# Patient Record
Sex: Male | Born: 1995 | Race: Black or African American | Hispanic: No | Marital: Single | Smoking: Current some day smoker
Health system: Southern US, Community
[De-identification: ages and names within clinical notes are randomized; demographics above are authoritative.]

## PROBLEM LIST (undated history)

## (undated) DIAGNOSIS — J45909 Unspecified asthma, uncomplicated: Secondary | ICD-10-CM

---
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
## 2015-09-29 ENCOUNTER — Emergency Department (INDEPENDENT_AMBULATORY_CARE_PROVIDER_SITE_OTHER)
Admission: EM | Admit: 2015-09-29 | Discharge: 2015-09-29 | Disposition: A | Discharge status: Home / Self Care | Payer: 59 | Source: Home / Self Care | Attending: Family Medicine | Admitting: Family Medicine

## 2015-09-29 ENCOUNTER — Encounter (HOSPITAL_COMMUNITY): Payer: Self-pay | Admitting: *Deleted

## 2015-09-29 DIAGNOSIS — S0592XA Unspecified injury of left eye and orbit, initial encounter: Secondary | ICD-10-CM

## 2015-09-29 NOTE — ED Notes (Signed)
Pt   Advised  To  Go  Directly  To  The  Eye   Dr     Isidore Moosffice        And  Do not  Stop       Directions  Give   And  The  Importance  Of  Adequate followup  Stressed  To the  Pt
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
## 2015-09-29 NOTE — ED Provider Notes (Signed)
CSN: [id number]     Arrival date & time 09/29/15  1633 History   First MD Initiated Contact with Patient 09/29/15 1701     Chief Complaint  Patient presents with  . Eye Injury   (Consider location/radiation/quality/duration/timing/severity/associated sxs/prior Treatment) Patient is a 19 y.o. male presenting with eye injury. The history is provided by the patient.  Eye Injury This is a new problem. The current episode started 6 to 12 hours ago (hit in left eye this am with nuff ball and grad lost vision in eye.). The problem has been gradually worsening.    Past Medical History  Diagnosis Date  . Asthma    History reviewed. No pertinent past surgical history. History reviewed. No pertinent family history. Social History  Substance Use Topics  . Smoking status: Never Smoker   . Smokeless tobacco: None  . Alcohol Use: No    Review of Systems  Constitutional: Negative.   Eyes: Positive for redness and visual disturbance. Negative for pain, discharge and itching.  All other systems reviewed and are negative.   Allergies  Review of patient's allergies indicates no known allergies.  Home Medications   Prior to Admission medications   Medication Sig Start Date End Date Taking? Authorizing Provider  terbinafine (LAMISIL) 250 MG tablet Take 250 mg by mouth daily.    Historical Provider, MD   Meds Ordered and Administered this Visit  Medications - No data to display  BP 144/67 mmHg  Pulse 62  Temp(Src) 98.4 F (36.9 C) (Oral)  Resp 20  SpO2 99% No data found.   Physical Exam  Constitutional: He appears well-developed and well-nourished. No distress.  Eyes: EOM and lids are normal. Lids are everted and swept, no foreign bodies found. Left conjunctiva is injected.  Fundoscopic exam:      The left eye shows hemorrhage.  Slit lamp exam:      The left eye shows no hyphema.  Nursing note and vitals reviewed.   ED Course  Procedures (including critical care  time)  Labs Review Labs Reviewed - No data to display  Imaging Review No results found.   Visual Acuity Review  Right Eye Distance:   Left Eye Distance:   Bilateral Distance:    Right Eye Near:   Left Eye Near:    Bilateral Near:         MDM   1. Eye injury, left, initial encounter    Discussed with dr bowen-ophth- who advised that pt come directly and immed to his office for eval.pt understands and agrees.   Linna HoffJames D Roarke Marciano, MD 09/29/15 [phone]

## 2015-09-29 NOTE — Discharge Instructions (Signed)
Go directly to eye doctor °

## 2016-06-06 ENCOUNTER — Emergency Department (HOSPITAL_COMMUNITY): Payer: 59

## 2016-06-06 ENCOUNTER — Emergency Department (HOSPITAL_COMMUNITY)
Admission: EM | Admit: 2016-06-06 | Discharge: 2016-06-06 | Disposition: A | Discharge status: Home / Self Care | Payer: 59 | Attending: Emergency Medicine | Admitting: Emergency Medicine

## 2016-06-06 ENCOUNTER — Encounter (HOSPITAL_COMMUNITY): Payer: Self-pay | Admitting: *Deleted

## 2016-06-06 DIAGNOSIS — S93402A Sprain of unspecified ligament of left ankle, initial encounter: Secondary | ICD-10-CM | POA: Diagnosis not present

## 2016-06-06 DIAGNOSIS — Y929 Unspecified place or not applicable: Secondary | ICD-10-CM | POA: Diagnosis not present

## 2016-06-06 DIAGNOSIS — W230XXA Caught, crushed, jammed, or pinched between moving objects, initial encounter: Secondary | ICD-10-CM | POA: Insufficient documentation

## 2016-06-06 DIAGNOSIS — J45909 Unspecified asthma, uncomplicated: Secondary | ICD-10-CM | POA: Insufficient documentation

## 2016-06-06 DIAGNOSIS — Y9389 Activity, other specified: Secondary | ICD-10-CM | POA: Insufficient documentation

## 2016-06-06 DIAGNOSIS — F1290 Cannabis use, unspecified, uncomplicated: Secondary | ICD-10-CM | POA: Diagnosis not present

## 2016-06-06 DIAGNOSIS — F17210 Nicotine dependence, cigarettes, uncomplicated: Secondary | ICD-10-CM | POA: Diagnosis not present

## 2016-06-06 DIAGNOSIS — S99912A Unspecified injury of left ankle, initial encounter: Secondary | ICD-10-CM | POA: Diagnosis present

## 2016-06-06 DIAGNOSIS — Y990 Civilian activity done for income or pay: Secondary | ICD-10-CM | POA: Diagnosis not present

## 2016-06-06 MED ORDER — NAPROXEN 500 MG PO TABS: 500.0000 mg | ORAL_TABLET | Freq: Two times a day (BID) | ORAL | Status: DC

## 2016-06-06 NOTE — ED Provider Notes (Signed)
CSN: [id number]     Arrival date & time 06/06/16  2114 History  By signing my name below, I, Evon Slackerrance Branch, attest that this documentation has been prepared under the direction and in the presence of TRW AutomotiveKelly Rilyn Upshaw, PA-C. Electronically Signed: Evon Slackerrance Branch, ED Scribe. 06/06/2016. 9:45 PM.      Chief Complaint  Patient presents with  . Ankle Pain    Patient is a 20 y.o. male presenting with ankle pain. The history is provided by the patient. No language interpreter was used.  Ankle Pain  HPI Comments: Bryan Lin is a 20 y.o. male who presents to the Emergency Department complaining of left ankle pain onset today PTA. Pt states that his ankle was "smashed" in between 2 forklifts. Pt presents with associated swelling. Pt states that he is able to ambulate. He states that movement makes the pain worse. Pt states that he has tried ibuprofen with no relief. Pt denies numbness or tingling. No hx of prior injuries to L ankle.  Past Medical History  Diagnosis Date  . Asthma    History reviewed. No pertinent past surgical history. No family history on file. Social History  Substance Use Topics  . Smoking status: Current Every Day Smoker -- 0.50 packs/day    Types: Cigarettes  . Smokeless tobacco: None  . Alcohol Use: Yes     Comment: occ    Review of Systems  Musculoskeletal: Positive for joint swelling and arthralgias. Negative for gait problem.  Neurological: Negative for numbness.  All other systems reviewed and are negative.   Allergies  Review of patient's allergies indicates no known allergies.  Home Medications   Prior to Admission medications   Medication Sig Start Date End Date Taking? Authorizing Provider  naproxen (NAPROSYN) 500 MG tablet Take 1 tablet (500 mg total) by mouth 2 (two) times daily. 06/06/16   Antony MaduraKelly Shizue Kaseman, PA-C  terbinafine (LAMISIL) 250 MG tablet Take 250 mg by mouth daily.    Historical Provider, MD   BP 131/66 mmHg  Pulse 53  Temp(Src) 98.8 F  (37.1 C) (Oral)  Resp 18  Ht 6\' 3"  (1.905 m)  Wt 70.308 kg  BMI 19.37 kg/m2  SpO2 99%   Physical Exam  Constitutional: He is oriented to person, place, and time. He appears well-developed and well-nourished. No distress.  HENT:  Head: Normocephalic and atraumatic.  Eyes: Conjunctivae and EOM are normal. No scleral icterus.  Neck: Normal range of motion.  Cardiovascular: Normal rate, regular rhythm and intact distal pulses.   DP and PT pulses 2+ in the LLE. Capillary refill brisk in all digits of L foot.  Pulmonary/Chest: Effort normal. No respiratory distress.  Respirations even and unlabored.  Musculoskeletal: Normal range of motion.       Left ankle: He exhibits swelling. He exhibits normal range of motion and no deformity. Tenderness. Lateral malleolus and medial malleolus tenderness found. Achilles tendon normal.  Neurological: He is alert and oriented to person, place, and time. He exhibits normal muscle tone. Coordination normal.  Sensation to light touch intact in the LLE. Patient able to wiggle all toes.  Skin: Skin is warm and dry. No rash noted. He is not diaphoretic. No erythema. No pallor.  Psychiatric: He has a normal mood and affect. His behavior is normal.  Nursing note and vitals reviewed.   ED Course  Procedures (including critical care time) DIAGNOSTIC STUDIES: Oxygen Saturation is 98% on RA, normal by my interpretation.    COORDINATION OF CARE: 9:45 PM-Discussed treatment  plan with pt at bedside and pt agreed to plan.    Labs Review Labs Reviewed - No data to display  Imaging Review Dg Ankle Complete Left  06/06/2016  CLINICAL DATA:  20 year old male with left ankle injury. EXAM: LEFT ANKLE COMPLETE - 3+ VIEW COMPARISON:  None. FINDINGS: There is no acute fracture or dislocation. The bones are well mineralized. The ankle mortise is intact. There is mild soft tissue swelling of the ankle. No radiopaque foreign object. IMPRESSION: Negative. Electronically  Signed   By: Elgie CollardArash  Radparvar M.D.   On: 06/06/2016 22:20      EKG Interpretation None      MDM   Final diagnoses:  Ankle sprain, left, initial encounter    20 year old male since to the emergency department for evaluation of left ankle pain. He has normal range of motion and he is neurovascularly intact. Patient has been weightbearing since his injury. X-ray negative for fracture. Suspect ankle sprain. ASO ankle applied and have recommended supportive care including icing, elevation, and rest. Ibuprofen recommended for pain and return precautions given. Patient discharged in satisfactory condition with no unaddressed concerns.  I personally performed the services described in this documentation, which was scribed in my presence. The recorded information has been reviewed and is accurate.    Filed Vitals:   06/06/16 2129 06/06/16 2250  BP: 135/70 131/66  Pulse: 60 53  Temp: 98.8 F (37.1 C)   TempSrc: Oral   Resp: 18 18  Height: 6\' 3"  (1.905 m)   Weight: 70.308 kg   SpO2: 98% 99%        Antony MaduraKelly Billyjoe Go, PA-C 06/07/16 0012  Lavera Guiseana Duo Liu, MD 06/07/16 [phone]

## 2016-06-06 NOTE — Discharge Instructions (Signed)
Ankle Sprain °An ankle sprain is an injury to the strong, fibrous tissues (ligaments) that hold the bones of your ankle joint together.  °CAUSES °An ankle sprain is usually caused by a fall or by twisting your ankle. Ankle sprains most commonly occur when you step on the outer edge of your foot, and your ankle turns inward. People who participate in sports are more prone to these types of injuries.  °SYMPTOMS  °· Pain in your ankle. The pain may be present at rest or only when you are trying to stand or walk. °· Swelling. °· Bruising. Bruising may develop immediately or within 1 to 2 days after your injury. °· Difficulty standing or walking, particularly when turning corners or changing directions. °DIAGNOSIS  °Your caregiver will ask you details about your injury and perform a physical exam of your ankle to determine if you have an ankle sprain. During the physical exam, your caregiver will press on and apply pressure to specific areas of your foot and ankle. Your caregiver will try to move your ankle in certain ways. An X-ray exam may be done to be sure a bone was not broken or a ligament did not separate from one of the bones in your ankle (avulsion fracture).  °TREATMENT  °Certain types of braces can help stabilize your ankle. Your caregiver can make a recommendation for this. Your caregiver may recommend the use of medicine for pain. If your sprain is severe, your caregiver may refer you to a surgeon who helps to restore function to parts of your skeletal system (orthopedist) or a physical therapist. °HOME CARE INSTRUCTIONS  °· Apply ice to your injury for 1-2 days or as directed by your caregiver. Applying ice helps to reduce inflammation and pain. °· Put ice in a plastic bag. °· Place a towel between your skin and the bag. °· Leave the ice on for 15-20 minutes at a time, every 2 hours while you are awake. °· Only take over-the-counter or prescription medicines for pain, discomfort, or fever as directed by  your caregiver. °· Elevate your injured ankle above the level of your heart as much as possible for 2-3 days. °· If your caregiver recommends crutches, use them as instructed. Gradually put weight on the affected ankle. Continue to use crutches or a cane until you can walk without feeling pain in your ankle. °· If you have a plaster splint, wear the splint as directed by your caregiver. Do not rest it on anything harder than a pillow for the first 24 hours. Do not put weight on it. Do not get it wet. You may take it off to take a shower or bath. °· You may have been given an elastic bandage to wear around your ankle to provide support. If the elastic bandage is too tight (you have numbness or tingling in your foot or your foot becomes cold and blue), adjust the bandage to make it comfortable. °· If you have an air splint, you may blow more air into it or let air out to make it more comfortable. You may take your splint off at night and before taking a shower or bath. Wiggle your toes in the splint several times per day to decrease swelling. °SEEK MEDICAL CARE IF:  °· You have rapidly increasing bruising or swelling. °· Your toes feel extremely cold or you lose feeling in your foot. °· Your pain is not relieved with medicine. °SEEK IMMEDIATE MEDICAL CARE IF: °· Your toes are numb or blue. °·   You have severe pain that is increasing. °MAKE SURE YOU:  °· Understand these instructions. °· Will watch your condition. °· Will get help right away if you are not doing well or get worse. °  °This information is not intended to replace advice given to you by your health care provider. Make sure you discuss any questions you have with your health care provider. °  °Document Released: 11/26/2005 Document Revised: 12/17/2014 Document Reviewed: 12/08/2011 °Elsevier Interactive Patient Education ©2016 [employer] °RICE for Routine Care of Injuries °The routine care of many injuries includes rest, ice, compression, and elevation  (RICE therapy). RICE therapy is often recommended for injuries to soft tissues, such as a muscle strain, ligament injuries, bruises, and overuse injuries. It can also be used for some bony injuries. Using RICE therapy can help to relieve pain, lessen swelling, and enable your body to heal. °Rest °Rest is required to allow your body to heal. This usually involves reducing your normal activities and avoiding use of the injured part of your body. Generally, you can return to your normal activities when you are comfortable and have been given permission by your health care provider. °Ice °Icing your injury helps to keep the swelling down, and it lessens pain. Do not apply ice directly to your skin. °· Put ice in a plastic bag. °· Place a towel between your skin and the bag. °· Leave the ice on for 20 minutes, 2-3 times a day. °Do this for as long as you are directed by your health care provider. °Compression °Compression means putting pressure on the injured area. Compression helps to keep swelling down, gives support, and helps with discomfort. Compression may be done with an elastic bandage. If an elastic bandage has been applied, follow these general tips: °· Remove and reapply the bandage every 3-4 hours or as directed by your health care provider. °· Make sure the bandage is not wrapped too tightly, because this can cut off circulation. If part of your body beyond the bandage becomes blue, numb, cold, swollen, or more painful, your bandage is most likely too tight. If this occurs, remove your bandage and reapply it more loosely. °· See your health care provider if the bandage seems to be making your problems worse rather than better. °Elevation °Elevation means keeping the injured area raised. This helps to lessen swelling and decrease pain. If possible, your injured area should be elevated at or above the level of your heart or the center of your chest. °WHEN SHOULD I SEEK MEDICAL CARE? °You should seek medical  care if: °· Your pain and swelling continue. °· Your symptoms are getting worse rather than improving. °These symptoms may indicate that further evaluation or further X-rays are needed. Sometimes, X-rays may not show a small broken bone (fracture) until a number of days later. Make a follow-up appointment with your health care provider. °WHEN SHOULD I SEEK IMMEDIATE MEDICAL CARE? °You should seek immediate medical care if: °· You have sudden severe pain at or below the area of your injury. °· You have redness or increased swelling around your injury. °· You have tingling or numbness at or below the area of your injury that does not improve after you remove the elastic bandage. °  °This information is not intended to replace advice given to you by your health care provider. Make sure you discuss any questions you have with your health care provider. °  °Document Released: 03/10/2001 Document Revised: 08/17/2015 Document Reviewed: 11/03/2014 °Elsevier Interactive Patient Education ©2016 [employer] ° °

## 2016-06-06 NOTE — ED Notes (Signed)
Pt states that he was at work and was on the [employer]; pt states that he got his left ankle in between 2 [employer]; pt states that it "mashed" his ankle in the back; pt able to wiggle toes and flex ankle without difficulty; obvious swelling noted to ankle; + pulse, + sensation

## 2017-09-16 ENCOUNTER — Telehealth: Payer: Self-pay

## 2017-09-16 ENCOUNTER — Ambulatory Visit: Payer: 59 | Admitting: Family Medicine

## 2017-09-16 NOTE — Telephone Encounter (Signed)
Pt called and states that he walked in to the [facility] office to request a new pt appt. He was scheduled to see Dr. Salomon Fick in our office tomorrow afternoon. He is concerned because he has been having some chest pain x>39mo. He states that it does often radiate down his arm. He reports the pain is located in his upper left chest/shoulder area. He denies any pain with inspiration or with movement of his shoulder/neck. He states that he did play sports when he was younger and thinks he "may have a pinched nerve".   Advised pt that based on his reported symptoms it may be best for him to seek evaluation at an UC. He declines and states that he "does not want to spend extra money". I advised the pt that if his symptoms increase, pain radiates to jaw or he has trouble taking deep breath he needs to seek UC/ED immediately and not wait until tomorrow. Pt voiced understanding. Nothing further needed at this time.

## 2017-09-17 ENCOUNTER — Ambulatory Visit (INDEPENDENT_AMBULATORY_CARE_PROVIDER_SITE_OTHER): Payer: 59 | Admitting: Family Medicine

## 2017-09-17 ENCOUNTER — Telehealth: Payer: Self-pay | Admitting: Cardiovascular Disease

## 2017-09-17 ENCOUNTER — Encounter: Payer: Self-pay | Admitting: Family Medicine

## 2017-09-17 VITALS — BP 140/100 | HR 50 | Temp 97.8°F | Ht 75.0 in | Wt 154.6 lb

## 2017-09-17 DIAGNOSIS — R030 Elevated blood-pressure reading, without diagnosis of hypertension: Secondary | ICD-10-CM | POA: Diagnosis not present

## 2017-09-17 DIAGNOSIS — R079 Chest pain, unspecified: Secondary | ICD-10-CM | POA: Diagnosis not present

## 2017-09-17 DIAGNOSIS — Z7689 Persons encountering health services in other specified circumstances: Secondary | ICD-10-CM

## 2017-09-17 LAB — COMPREHENSIVE METABOLIC PANEL
ALT: 12 U/L (ref 0–53)
AST: 16 U/L (ref 0–37)
Albumin: 4.8 g/dL (ref 3.5–5.2)
Alkaline Phosphatase: 42 U/L (ref 39–117)
BUN: 10 mg/dL (ref 6–23)
CO2: 30 mEq/L (ref 19–32)
Calcium: 9.8 mg/dL (ref 8.4–10.5)
Chloride: 102 mEq/L (ref 96–112)
Creatinine, Ser: 1.04 mg/dL (ref 0.40–1.50)
GFR: 115.75 mL/min (ref >60.00)
GLUCOSE: 86 mg/dL (ref 70–99)
POTASSIUM: 4.6 meq/L (ref 3.5–5.1)
SODIUM: 138 meq/L (ref 135–145)
TOTAL PROTEIN: 7.3 g/dL (ref 6.0–8.3)
Total Bilirubin: 0.7 mg/dL (ref 0.2–1.2)

## 2017-09-17 LAB — CBC WITH DIFFERENTIAL/PLATELET
Basophils Absolute: 0 10*3/uL (ref 0.0–0.1)
Basophils Relative: 0.4 % (ref 0.0–3.0)
EOS PCT: 1.9 % (ref 0.0–5.0)
Eosinophils Absolute: 0.1 10*3/uL (ref 0.0–0.7)
HCT: 44.2 % (ref 39.0–52.0)
Hemoglobin: 14.6 g/dL (ref 13.0–17.0)
Lymphocytes Relative: 25.4 % (ref 12.0–46.0)
Lymphs Abs: 1.5 10*3/uL (ref 0.7–4.0)
MCHC: 32.9 g/dL (ref 30.0–36.0)
MCV: 102 fl — ABNORMAL HIGH (ref 78.0–100.0)
MONO ABS: 0.6 10*3/uL (ref 0.1–1.0)
Monocytes Relative: 9.1 % (ref 3.0–12.0)
Neutro Abs: 3.8 10*3/uL (ref 1.4–7.7)
Neutrophils Relative %: 63.2 % (ref 43.0–77.0)
Platelets: 187 10*3/uL (ref 150.0–400.0)
RBC: 4.34 Mil/uL (ref 4.22–5.81)
RDW: 13.7 % (ref 11.5–15.5)
WBC: 6.1 10*3/uL (ref 4.0–10.5)

## 2017-09-17 NOTE — Patient Instructions (Addendum)
DASH Eating Plan DASH stands for "Dietary Approaches to Stop Hypertension." The DASH eating plan is a healthy eating plan that has been shown to reduce high blood pressure (hypertension). It may also reduce your risk for type 2 diabetes, heart disease, and stroke. The DASH eating plan may also help with weight loss. What are tips for following this plan? General guidelines  Avoid eating more than 2,300 mg (milligrams) of salt (sodium) a day. If you have hypertension, you may need to reduce your sodium intake to 1,500 mg a day.  Limit alcohol intake to no more than 1 drink a day for nonpregnant women and 2 drinks a day for men. One drink equals 12 oz of beer, 5 oz of wine, or 1 oz of hard liquor.  Work with your health care provider to maintain a healthy body weight or to lose weight. Ask what an ideal weight is for you.  Get at least 30 minutes of exercise that causes your heart to beat faster (aerobic exercise) most days of the week. Activities may include walking, swimming, or biking.  Work with your health care provider or diet and nutrition specialist (dietitian) to adjust your eating plan to your individual calorie needs. Reading food labels  Check food labels for the amount of sodium per serving. Choose foods with less than 5 percent of the Daily Value of sodium. Generally, foods with less than 300 mg of sodium per serving fit into this eating plan.  To find whole grains, look for the word "whole" as the first word in the ingredient list. Shopping  Buy products labeled as "low-sodium" or "no salt added."  Buy fresh foods. Avoid canned foods and premade or frozen meals. Cooking  Avoid adding salt when cooking. Use salt-free seasonings or herbs instead of table salt or sea salt. Check with your health care provider or pharmacist before using salt substitutes.  Do not fry foods. Cook foods using healthy methods such as baking, boiling, grilling, and broiling instead.  Cook with  heart-healthy oils, such as olive, canola, soybean, or sunflower oil. Meal planning   Eat a balanced diet that includes: ? 5 or more servings of fruits and vegetables each day. At each meal, try to fill half of your plate with fruits and vegetables. ? Up to 6-8 servings of whole grains each day. ? Less than 6 oz of lean meat, poultry, or fish each day. A 3-oz serving of meat is about the same size as a deck of cards. One egg equals 1 oz. ? 2 servings of low-fat dairy each day. ? A serving of nuts, seeds, or beans 5 times each week. ? Heart-healthy fats. Healthy fats called Omega-3 fatty acids are found in foods such as flaxseeds and coldwater fish, like sardines, salmon, and mackerel.  Limit how much you eat of the following: ? Canned or prepackaged foods. ? Food that is high in trans fat, such as fried foods. ? Food that is high in saturated fat, such as fatty meat. ? Sweets, desserts, sugary drinks, and other foods with added sugar. ? Full-fat dairy products.  Do not salt foods before eating.  Try to eat at least 2 vegetarian meals each week.  Eat more home-cooked food and less restaurant, buffet, and fast food.  When eating at a restaurant, ask that your food be prepared with less salt or no salt, if possible. What foods are recommended? The items listed may not be a complete list. Talk with your dietitian about what   dietary choices are best for you. Grains Whole-grain or whole-wheat bread. Whole-grain or whole-wheat pasta. Brown rice. Oatmeal. Quinoa. Bulgur. Whole-grain and low-sodium cereals. Pita bread. Low-fat, low-sodium crackers. Whole-wheat flour tortillas. Vegetables Fresh or frozen vegetables (raw, steamed, roasted, or grilled). Low-sodium or reduced-sodium tomato and vegetable juice. Low-sodium or reduced-sodium tomato sauce and tomato paste. Low-sodium or reduced-sodium canned vegetables. Fruits All fresh, dried, or frozen fruit. Canned fruit in natural juice (without  added sugar). Meat and other protein foods Skinless chicken or turkey. Ground chicken or turkey. Pork with fat trimmed off. Fish and seafood. Egg whites. Dried beans, peas, or lentils. Unsalted nuts, nut butters, and seeds. Unsalted canned beans. Lean cuts of beef with fat trimmed off. Low-sodium, lean deli meat. Dairy Low-fat (1%) or fat-free (skim) milk. Fat-free, low-fat, or reduced-fat cheeses. Nonfat, low-sodium ricotta or cottage cheese. Low-fat or nonfat yogurt. Low-fat, low-sodium cheese. Fats and oils Soft margarine without trans fats. Vegetable oil. Low-fat, reduced-fat, or light mayonnaise and salad dressings (reduced-sodium). Canola, safflower, olive, soybean, and sunflower oils. Avocado. Seasoning and other foods Herbs. Spices. Seasoning mixes without salt. Unsalted popcorn and pretzels. Fat-free sweets. What foods are not recommended? The items listed may not be a complete list. Talk with your dietitian about what dietary choices are best for you. Grains Baked goods made with fat, such as croissants, muffins, or some breads. Dry pasta or rice meal packs. Vegetables Creamed or fried vegetables. Vegetables in a cheese sauce. Regular canned vegetables (not low-sodium or reduced-sodium). Regular canned tomato sauce and paste (not low-sodium or reduced-sodium). Regular tomato and vegetable juice (not low-sodium or reduced-sodium). Pickles. Olives. Fruits Canned fruit in a light or heavy syrup. Fried fruit. Fruit in cream or butter sauce. Meat and other protein foods Fatty cuts of meat. Ribs. Fried meat. Bacon. Sausage. Bologna and other processed lunch meats. Salami. Fatback. Hotdogs. Bratwurst. Salted nuts and seeds. Canned beans with added salt. Canned or smoked fish. Whole eggs or egg yolks. Chicken or turkey with skin. Dairy Whole or 2% milk, cream, and half-and-half. Whole or full-fat cream cheese. Whole-fat or sweetened yogurt. Full-fat cheese. Nondairy creamers. Whipped toppings.  Processed cheese and cheese spreads. Fats and oils Butter. Stick margarine. Lard. Shortening. Ghee. Bacon fat. Tropical oils, such as coconut, palm kernel, or palm oil. Seasoning and other foods Salted popcorn and pretzels. Onion salt, garlic salt, seasoned salt, table salt, and sea salt. Worcestershire sauce. Tartar sauce. Barbecue sauce. Teriyaki sauce. Soy sauce, including reduced-sodium. Steak sauce. Canned and packaged gravies. Fish sauce. Oyster sauce. Cocktail sauce. Horseradish that you find on the shelf. Ketchup. Mustard. Meat flavorings and tenderizers. Bouillon cubes. Hot sauce and Tabasco sauce. Premade or packaged marinades. Premade or packaged taco seasonings. Relishes. Regular salad dressings. Where to find more information:  National Heart, Lung, and Blood Institute: www.nhlbi.nih.gov  American Heart Association: www.heart.org Summary  The DASH eating plan is a healthy eating plan that has been shown to reduce high blood pressure (hypertension). It may also reduce your risk for type 2 diabetes, heart disease, and stroke.  With the DASH eating plan, you should limit salt (sodium) intake to 2,300 mg a day. If you have hypertension, you may need to reduce your sodium intake to 1,500 mg a day.  When on the DASH eating plan, aim to eat more fresh fruits and vegetables, whole grains, lean proteins, low-fat dairy, and heart-healthy fats.  Work with your health care provider or diet and nutrition specialist (dietitian) to adjust your eating plan to your individual   calorie needs. This information is not intended to replace advice given to you by your health care provider. Make sure you discuss any questions you have with your health care provider. Document Released: 11/15/2011 Document Revised: 11/19/2016 Document Reviewed: 11/19/2016 Elsevier Interactive Patient Education  2017 Else[employer] Inc.  Managing Your Hypertension Hypertension is commonly called high blood pressure. This is when  the force of your blood pressing against the walls of your arteries is too strong. Arteries are blood vessels that carry blood from your heart throughout your body. Hypertension forces the heart to work harder to pump blood, and may cause the arteries to become narrow or stiff. Having untreated or uncontrolled hypertension can cause heart attack, stroke, kidney disease, and other problems. What are blood pressure readings? A blood pressure reading consists of a higher number over a lower number. Ideally, your blood pressure should be below 120/80. The first ("top") number is called the systolic pressure. It is a measure of the pressure in your arteries as your heart beats. The second ("bottom") number is called the diastolic pressure. It is a measure of the pressure in your arteries as the heart relaxes. What does my blood pressure reading mean? Blood pressure is classified into four stages. Based on your blood pressure reading, your health care provider may use the following stages to determine what type of treatment you need, if any. Systolic pressure and diastolic pressure are measured in a unit called mm Hg. Normal  Systolic pressure: below 120.  Diastolic pressure: below 80. Elevated  Systolic pressure: 120-129.  Diastolic pressure: below 80. Hypertension stage 1  Systolic pressure: 130-139.  Diastolic pressure: 80-89. Hypertension stage 2  Systolic pressure: 140 or above.  Diastolic pressure: 90 or above. What health risks are associated with hypertension? Managing your hypertension is an important responsibility. Uncontrolled hypertension can lead to:  A heart attack.  A stroke.  A weakened blood vessel (aneurysm).  Heart failure.  Kidney damage.  Eye damage.  Metabolic syndrome.  Memory and concentration problems.  What changes can I make to manage my hypertension? Hypertension can be managed by making lifestyle changes and possibly by taking medicines. Your  health care provider will help you make a plan to bring your blood pressure within a normal range. Eating and drinking  Eat a diet that is high in fiber and potassium, and low in salt (sodium), added sugar, and fat. An example eating plan is called the DASH (Dietary Approaches to Stop Hypertension) diet. To eat this way: ? Eat plenty of fresh fruits and vegetables. Try to fill half of your plate at each meal with fruits and vegetables. ? Eat whole grains, such as whole wheat pasta, brown rice, or whole grain bread. Fill about one quarter of your plate with whole grains. ? Eat low-fat diary products. ? Avoid fatty cuts of meat, processed or cured meats, and poultry with skin. Fill about one quarter of your plate with lean proteins such as fish, chicken without skin, beans, eggs, and tofu. ? Avoid premade and processed foods. These tend to be higher in sodium, added sugar, and fat.  Reduce your daily sodium intake. Most people with hypertension should eat less than 1,500 mg of sodium a day.  Limit alcohol intake to no more than 1 drink a day for nonpregnant women and 2 drinks a day for men. One drink equals 12 oz of beer, 5 oz of wine, or 1 oz of hard liquor. Lifestyle  Work with your health care provider   to maintain a healthy body weight, or to lose weight. Ask what an ideal weight is for you.  Get at least 30 minutes of exercise that causes your heart to beat faster (aerobic exercise) most days of the week. Activities may include walking, swimming, or biking.  Include exercise to strengthen your muscles (resistance exercise), such as weight lifting, as part of your weekly exercise routine. Try to do these types of exercises for 30 minutes at least 3 days a week.  Do not use any products that contain nicotine or tobacco, such as cigarettes and e-cigarettes. If you need help quitting, ask your health care provider.  Control any long-term (chronic) conditions you have, such as high cholesterol  or diabetes. Monitoring  Monitor your blood pressure at home as told by your health care provider. Your personal target blood pressure may vary depending on your medical conditions, your age, and other factors.  Have your blood pressure checked regularly, as often as told by your health care provider. Working with your health care provider  Review all the medicines you take with your health care provider because there may be side effects or interactions.  Talk with your health care provider about your diet, exercise habits, and other lifestyle factors that may be contributing to hypertension.  Visit your health care provider regularly. Your health care provider can help you create and adjust your plan for managing hypertension. Will I need medicine to control my blood pressure? Your health care provider may prescribe medicine if lifestyle changes are not enough to get your blood pressure under control, and if:  Your systolic blood pressure is 130 or higher.  Your diastolic blood pressure is 80 or higher.  Take medicines only as told by your health care provider. Follow the directions carefully. Blood pressure medicines must be taken as prescribed. The medicine does not work as well when you skip doses. Skipping doses also puts you at risk for problems. Contact a health care provider if:  You think you are having a reaction to medicines you have taken.  You have repeated (recurrent) headaches.  You feel dizzy.  You have swelling in your ankles.  You have trouble with your vision. Get help right away if:  You develop a severe headache or confusion.  You have unusual weakness or numbness, or you feel faint.  You have severe pain in your chest or abdomen.  You vomit repeatedly.  You have trouble breathing. Summary  Hypertension is when the force of blood pumping through your arteries is too strong. If this condition is not controlled, it may put you at risk for serious  complications.  Your personal target blood pressure may vary depending on your medical conditions, your age, and other factors. For most people, a normal blood pressure is less than 120/80.  Hypertension is managed by lifestyle changes, medicines, or both. Lifestyle changes include weight loss, eating a healthy, low-sodium diet, exercising more, and limiting alcohol. This information is not intended to replace advice given to you by your health care provider. Make sure you discuss any questions you have with your health care provider. Document Released: 08/20/2012 Document Revised: 10/24/2016 Document Reviewed: 10/24/2016 Elsevier Interactive Patient Education  2018 Elsevier Inc.  

## 2017-09-17 NOTE — Progress Notes (Addendum)
Patient presents to clinic today to establish care.  SUBJECTIVE: PMH: Pt is a 21 yo with pmh sig for childhood asthma.  Pt was formerly seen by Dr. Carlynn Purl, Pediatrics.  Chest Pain, acute: -x 2 months -starts in upper left chest, underneath collar bone. Will sometimes radiate to L shoulder and sometimes into L arm. -Pt endorses some SOB with the pain -pain noted as aggravating, will keep awake at night.   -Ibuprofen 800 mg helps -denies HA, blurred vision, N/V, recent injury -Pt played football in HS.  Endorses having "zingers" aka pinched nerves, but he never had that checked out.  Allergies: NKDA  PSurgHx:  None  Social Hx: Pt is single.  He graduated HS and is currently employed by [employer] as a Pensions consultant.  Pt recently moved back to the area from [place].  Pt endorses smoking 2-3 cigs per wk.  Pt endorses heavy drinking while in [place], stating would drink about a fifth a day.  Now a fifth of [place] may  last a few days.  Pt also endorses daily marijuana use.  Family Hx: Mom-HLD, HTN Dad-HLD, HTN, CVA Sister, Faith- DM Sister, 2023/03/15- AAW Brother-Shawn- deceased.    Health Maintenance: Dental -- Dr. Allison Quarry   Past Medical History:  Diagnosis Date  . Asthma     History reviewed. No pertinent surgical history.  No current outpatient prescriptions on file prior to visit.   No current facility-administered medications on file prior to visit.     No Known Allergies  Family History  Problem Relation Age of Onset  . Hypertension Mother   . Hypertension Father   . Stroke Father   . Diabetes Sister     Social History   Social History  . Marital status: Single    Spouse name: N/A  . Number of children: N/A  . Years of education: N/A   Occupational History  . Not on file.   Social History Main Topics  . Smoking status: Current Some Day Smoker    Packs/day: 0.50    Types: Cigarettes  . Smokeless tobacco: Never Used  . Alcohol use Yes     Comment: occ  .  Drug use: Yes    Types: Marijuana  . Sexual activity: Not on file   Other Topics Concern  . Not on file   Social History Narrative  . No narrative on file    ROS General: Denies fever, chills, night sweats, changes in weight, changes in appetite HEENT: Denies headaches, ear pain, changes in vision, rhinorrhea, sore throat CV: Denies palpitations, orthopnea  +Chest pain, some SOB Pulm: Denies cough, wheezing  +SOB GI: Denies abdominal pain, nausea, vomiting, diarrhea, constipation GU: Denies dysuria, hematuria, frequency, vaginal discharge Msk: Denies muscle cramps, joint pains Neuro: Denies weakness, numbness, tingling Skin: Denies rashes, bruising Psych: Denies depression, anxiety, hallucinations  BP (!) 140/100 (BP Location: Right Arm, Patient Position: Sitting, Cuff Size: Normal)   Pulse (!) 50   Temp 97.8 F (36.6 C) (Oral)   Ht  (1.905 m)   Wt 154 lb 9.6 oz (70.1 kg)   BMI 19.32 kg/m   Physical Exam Gen. Pleasant, well developed, well-nourished, in NAD HEENT - [place]/AT, PERRL, no scleral icterus, no nasal drainage, pharynx without erythema or exudate. Neck: No JVD, no thyromegaly Lungs: no accessory muscle use, CTAB, no wheezes, rales or rhonchi Cardiovascular: RRR, No r/g/m, no peripheral edema Abdomen: BS present, soft, nontender,nondistended, no hepatosplenomegaly Musculoskeletal: No deformities, moves all four extremities, no cyanosis  or clubbing, normal tone.  No TTP of b/l clavicle, chest, and shoulders. Neuro:  A&Ox3, CN II-XII intact, normal gait Skin:  Warm, dry, intact, no lesions, tattoos on chest Psych: normal affect, mood appropriate  No results found for this or any previous visit (from the past 2160 hour(s)).  Assessment/Plan: Chest pain, unspecified type  -Discussed various causes of CP including cardiac, MSK, neuro, infection. -no TTP on exam or h/o injury so less likely MSK -Plan: EKG 12-Lead -EKG with Bradycardia, HR in upper 40s.   Discussed EKG with Cardiology on call physician Dr. Marland KitchenC" as this provider also noted somewhat peaked T waves. Appreciate his assistance.  Dr. Salena Saner noted the EKG was bradycardic, but otherwise normal for a person tall in stature.  -Given RTC or ED precautions if pain becomes worse.  Elevated BP without diagnosis of hypertension  -BP checked in both arms and again repeated later.  140/100, 140/94 -Discussed need for medication. -Pt to RTC tomorrow for bp recheck and medication -Given handouts on HTN and healthy eating habits -Plan: CBC with Differential/Platelet, Comprehensive metabolic panel, CBC with Differential/Platelet -Given RTC or ED precautions for symptoms including but not limited to HA, blurred vision, neck pain, fever, arm numbness, etc.  Encounter to establish care -records release    RTC tomorrow.

## 2017-09-17 NOTE — Telephone Encounter (Signed)
Reviewed ECG with primary care provider

## 2017-09-18 ENCOUNTER — Encounter: Payer: Self-pay | Admitting: Family Medicine

## 2017-09-18 ENCOUNTER — Ambulatory Visit (INDEPENDENT_AMBULATORY_CARE_PROVIDER_SITE_OTHER): Payer: 59 | Admitting: Family Medicine

## 2017-09-18 VITALS — BP 122/76 | HR 67

## 2017-09-18 DIAGNOSIS — Z0130 Encounter for examination of blood pressure without abnormal findings: Secondary | ICD-10-CM

## 2017-09-18 NOTE — Progress Notes (Signed)
Pt seen for f/u on bp yesterday.  BP in clinic yesterday was 140/100, remained high on repeats.  EKG was normal.  CBC and CMP were normal.  Pt returned today for bp recheck.  Pt denies HA, CP, SOB, blurred vision.  ROS: 10 point review of systems done and negative.  bp 122/76   Repeat 128/52  General: pleasant, well nourished, in NAD HEENT: face symmetric, [place]/AT, PERRL Lungs: no accessory muscle use. Heart: RRR. No r/g/m  A/P: BP recheck. Normotensive this visit Will have pt f/u in 2-4 wks to recheck bp.

## 2017-09-19 ENCOUNTER — Telehealth: Payer: Self-pay | Admitting: Family Medicine

## 2017-09-19 NOTE — Telephone Encounter (Signed)
Would like to have a call back personal matter

## 2017-09-20 ENCOUNTER — Telehealth: Payer: Self-pay | Admitting: Emergency Medicine

## 2017-09-20 NOTE — Telephone Encounter (Signed)
Patient is still having continuous chest pain. Patient states that the pain started back the day after he last saw Dr. Salomon Fick. Patient states that his chest feels tight advised patient that Dr. Salomon Fick will be informed but if the pain gets worse to please go to the emergency room.

## 2017-09-20 NOTE — Telephone Encounter (Signed)
Left a VM for patient regarding chest pain. Per Dr. Salomon Fick since patient EKG was normal she can order a chest x- ray if okay with patient.

## 2017-09-23 ENCOUNTER — Other Ambulatory Visit: Payer: Self-pay | Admitting: Family Medicine

## 2017-09-23 DIAGNOSIS — R079 Chest pain, unspecified: Secondary | ICD-10-CM

## 2017-09-23 NOTE — Telephone Encounter (Signed)
Spoke with patient and he states that he is still having chest pain. Patient would like to proceed with having the chest x-ray

## 2017-09-23 NOTE — Telephone Encounter (Signed)
Pt is calling and would like to have a call back did not elaborate on the reason.

## 2017-09-26 ENCOUNTER — Ambulatory Visit (INDEPENDENT_AMBULATORY_CARE_PROVIDER_SITE_OTHER)
Admission: RE | Admit: 2017-09-26 | Discharge: 2017-09-26 | Disposition: A | Discharge status: Home / Self Care | Payer: 59 | Source: Ambulatory Visit | Attending: Family Medicine | Admitting: Family Medicine

## 2017-09-26 DIAGNOSIS — R079 Chest pain, unspecified: Secondary | ICD-10-CM

## 2017-10-02 ENCOUNTER — Other Ambulatory Visit: Payer: Self-pay | Admitting: Emergency Medicine

## 2017-10-02 DIAGNOSIS — R079 Chest pain, unspecified: Secondary | ICD-10-CM

## 2017-10-02 NOTE — Telephone Encounter (Signed)
Pt had xray on 09/26/17

## 2017-10-23 ENCOUNTER — Ambulatory Visit (INDEPENDENT_AMBULATORY_CARE_PROVIDER_SITE_OTHER): Payer: 59

## 2017-10-23 ENCOUNTER — Other Ambulatory Visit: Payer: Self-pay | Admitting: Family Medicine

## 2017-10-23 ENCOUNTER — Encounter (INDEPENDENT_AMBULATORY_CARE_PROVIDER_SITE_OTHER): Payer: Self-pay

## 2017-10-23 ENCOUNTER — Telehealth: Payer: Self-pay

## 2017-10-23 DIAGNOSIS — R001 Bradycardia, unspecified: Secondary | ICD-10-CM

## 2017-10-23 DIAGNOSIS — R079 Chest pain, unspecified: Secondary | ICD-10-CM

## 2017-10-23 DIAGNOSIS — R9431 Abnormal electrocardiogram [ECG] [EKG]: Secondary | ICD-10-CM

## 2017-10-23 NOTE — Telephone Encounter (Signed)
Called patient no answer.Left message on voice mail we received monitor report revealing fast heart beat this morning at 11:19 am.Advised to call back concerning this.

## 2019-05-07 IMAGING — DX DG CHEST 2V
2 series · 2 of 2 positions shown · non-contrast
Comparison: None available

CLINICAL DATA: LEFT anterior chest pain for 1 month, history
asthma, smoking

EXAM:
CHEST  2 VIEW

[chest pa]
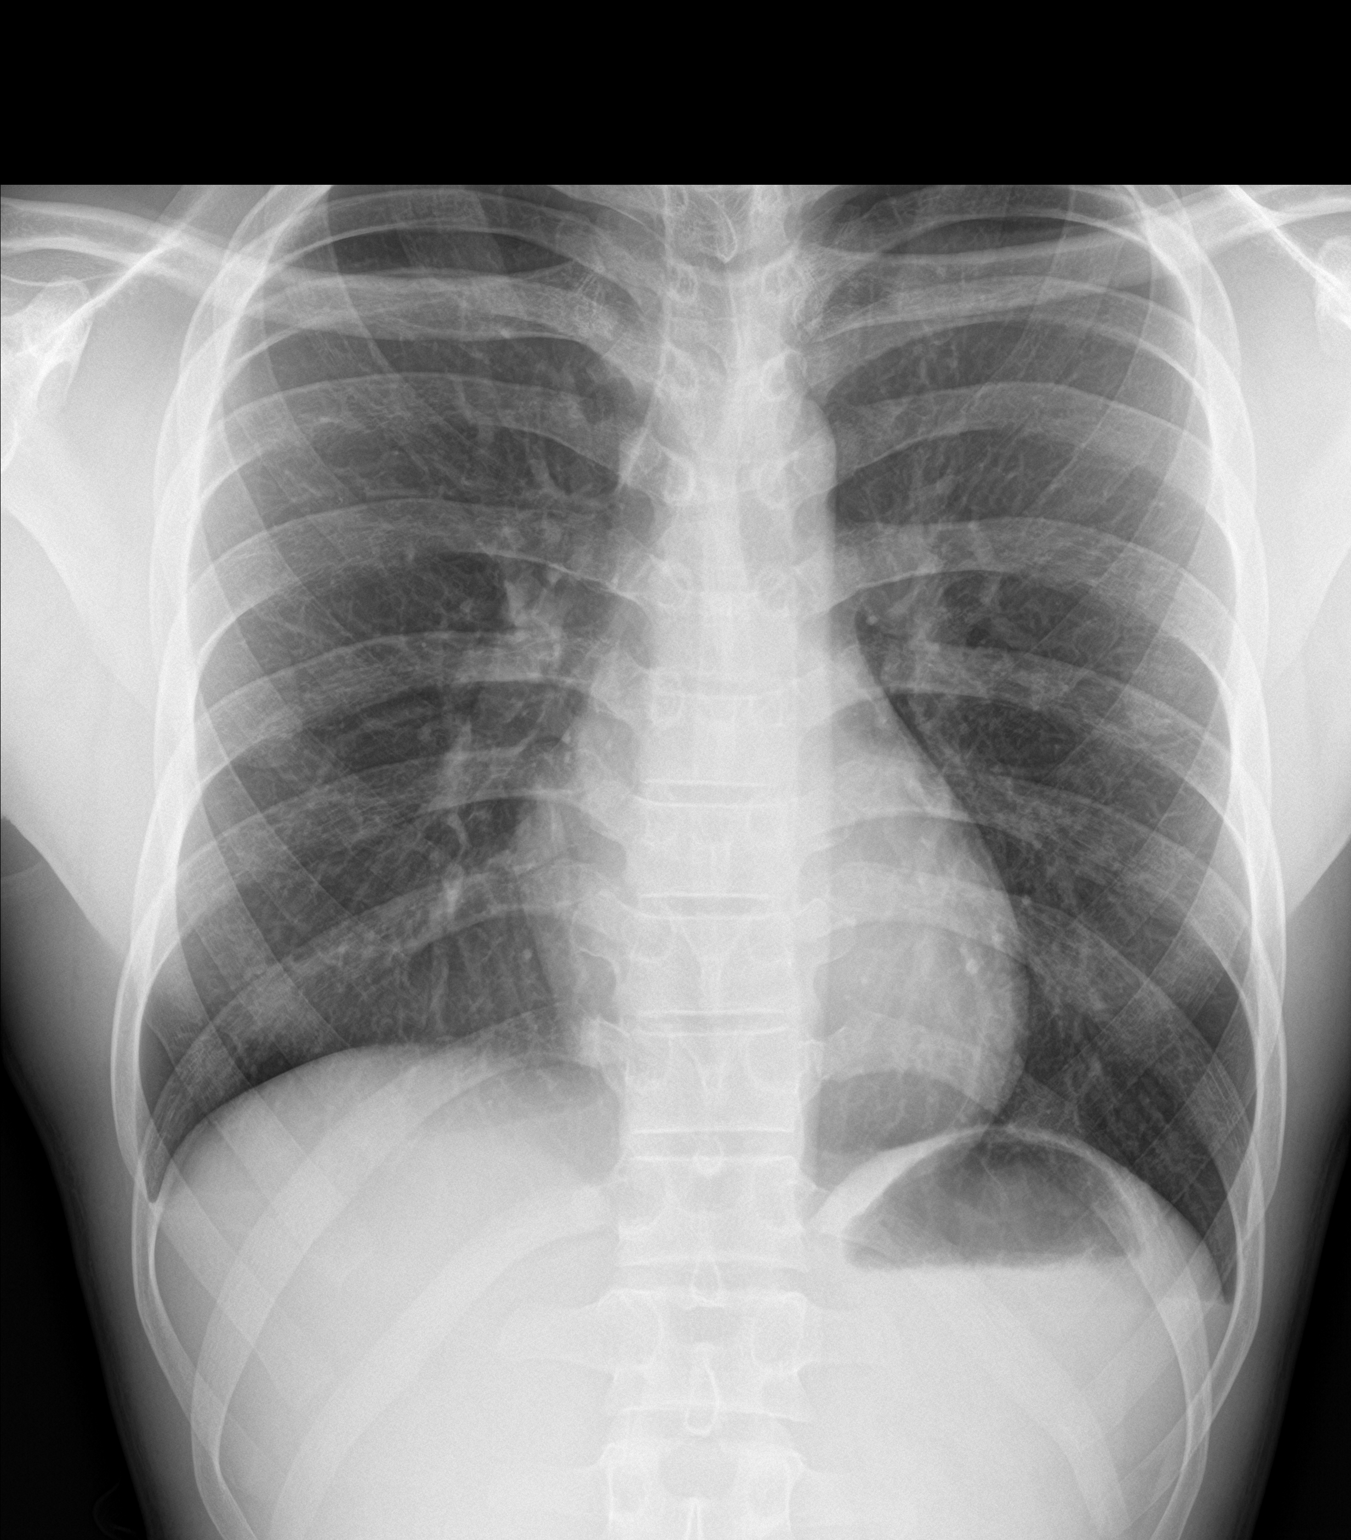

[chest lat]
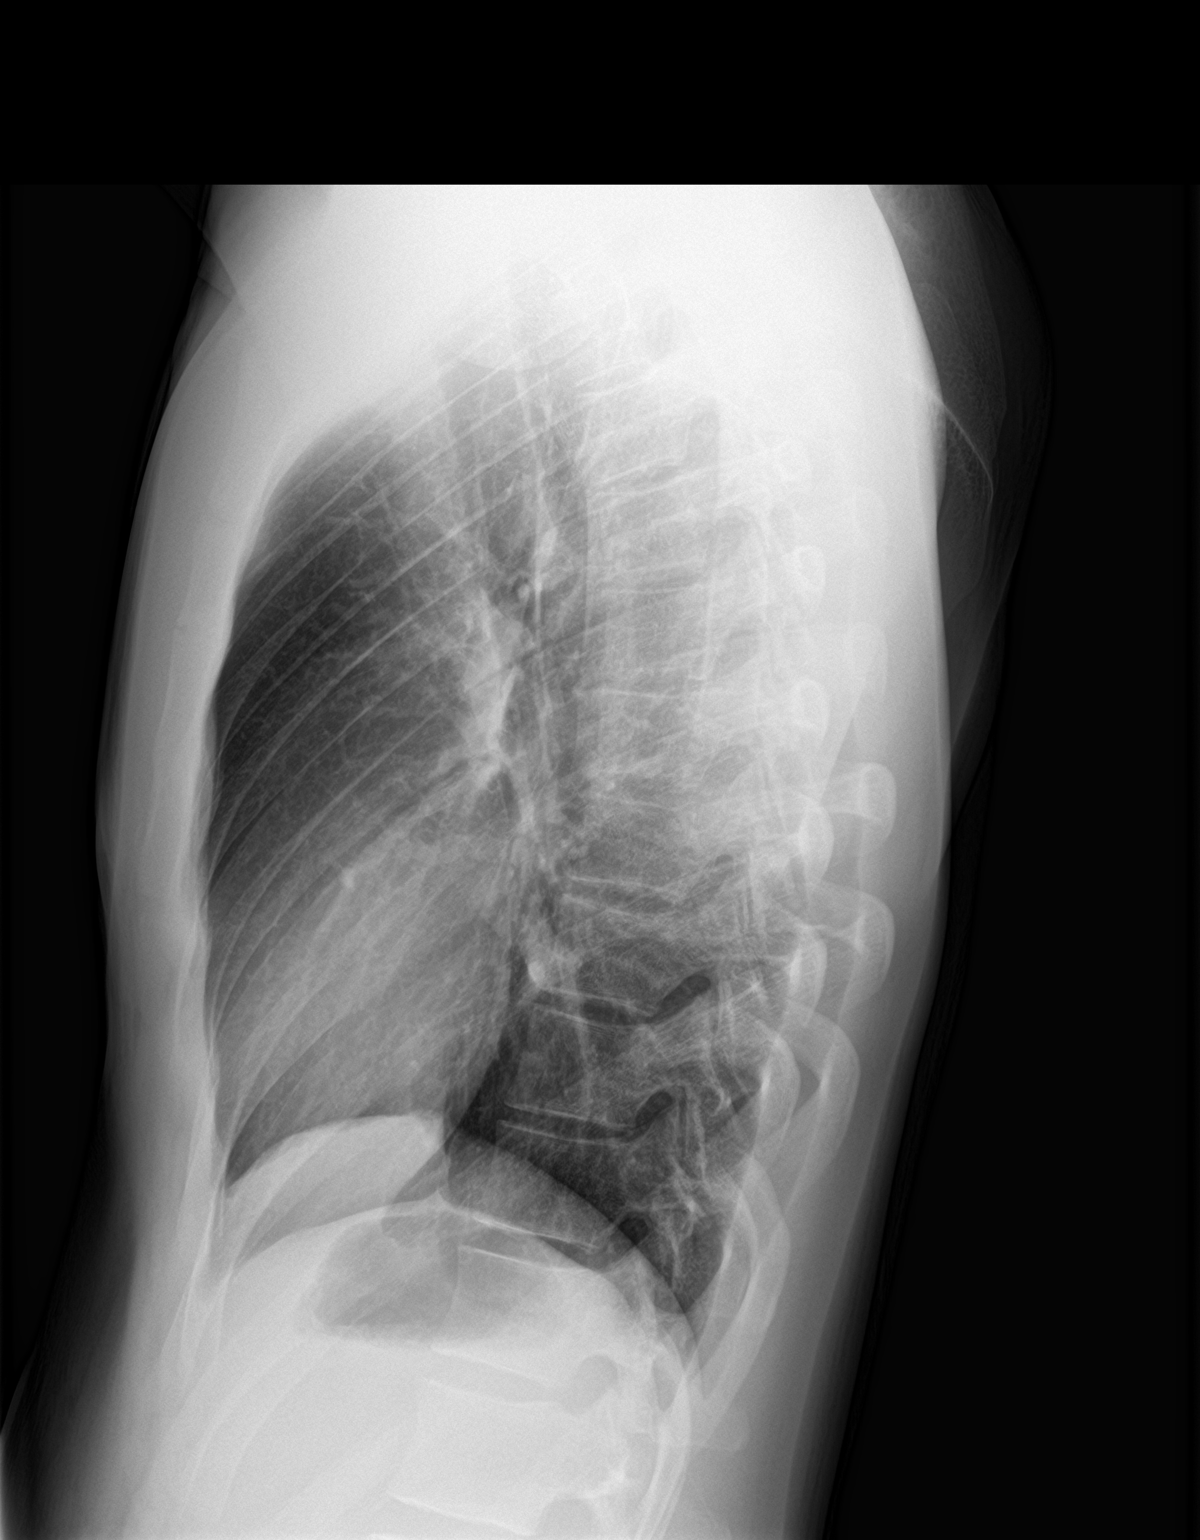

[2 of 2 positions shown; findings below may reference images not displayed]

FINDINGS: Normal heart size, mediastinal contours, and pulmonary vascularity.

Lungs clear.

No pleural effusion or pneumothorax.

Bones unremarkable.
IMPRESSION: Normal exam.

## 2019-10-01 ENCOUNTER — Other Ambulatory Visit: Payer: Self-pay

## 2019-10-01 ENCOUNTER — Ambulatory Visit (HOSPITAL_COMMUNITY)
Admission: EM | Admit: 2019-10-01 | Discharge: 2019-10-01 | Disposition: A | Discharge status: Home / Self Care | Payer: Self-pay | Attending: Family Medicine | Admitting: Family Medicine

## 2019-10-01 DIAGNOSIS — A6001 Herpesviral infection of penis: Secondary | ICD-10-CM

## 2019-10-01 MED ORDER — VALACYCLOVIR HCL 500 MG PO TABS: 500.0000 mg | ORAL_TABLET | Freq: Every day | ORAL | 2 refills | Status: AC

## 2019-10-01 NOTE — Discharge Instructions (Signed)
Take the valacyclovir one time a day every day If you want additional refills you  need to see your primary care doctor

## 2019-10-01 NOTE — ED Triage Notes (Signed)
Pt states she has Herpes and he want a new prescription that he can use everyday.

## 2019-10-01 NOTE — ED Provider Notes (Signed)
[facility]    CSN: [id number] Arrival date & time: 10/01/19  1646      History   Chief Complaint Chief Complaint  Patient presents with  . Medication Refill    HPI Bryan Lin is a 23 y.o. male.   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  He brings with him a lab test that documents he was positive for HSV-2 antibodies.  He states that he gets breakouts every couple of months.  He states that he would like to go on Valtrex daily instead of as needed to handle the breakouts.  He is recently incarcerated.  Instead of jail.  Has more than 1 lady friend.  It is resistant to using condoms.  He has been looking on the Internet and has decided he like to be on daily suppression. I explained to the patient that this is an urgent care center not a primary care center.  We do not do ongoing maintenance of chronic medical problems.  I will give him enough medicine to get him started in until he can go see his primary care doctor.  He is recommended to see her in follow-up for additional refills.  Past Medical History:  Diagnosis Date  . Asthma     Patient Active Problem List   Diagnosis Date Noted  . Elevated BP without diagnosis of hypertension 09/17/2017  . Chest pain 09/17/2017  . Right ankle sprain 01/11/2014    No past surgical history on file.     Home Medications    Prior to Admission medications   Medication Sig Start Date End Date Taking? Authorizing Provider  valACYclovir (VALTREX) 500 MG tablet Take 1 tablet (500 mg total) by mouth daily. 10/01/19   Eustace Moore, MD    Family History Family History  Problem Relation Age of Onset  . Hypertension Mother   . Hypertension Father   . Stroke Father   . Diabetes Sister     Social History Social History   Tobacco Use  . Smoking status: Current Some Day Smoker    Packs/day: 0.50    Types: Cigarettes  . Smokeless tobacco: Never Used  Substance Use Topics  . Alcohol use: Yes   Comment: occ  . Drug use: Yes    Types: Marijuana     Allergies   Patient has no known allergies.   Review of Systems Review of Systems  Constitutional: Negative for chills and fever.  HENT: Negative for ear pain and sore throat.   Eyes: Negative for pain and visual disturbance.  Respiratory: Negative for cough and shortness of breath.   Cardiovascular: Negative for chest pain and palpitations.  Gastrointestinal: Negative for abdominal pain and vomiting.  Genitourinary: Negative for dysuria and hematuria.  Musculoskeletal: Negative for arthralgias and back pain.  Skin: Negative for color change and rash.  Neurological: Negative for seizures and syncope.  All other systems reviewed and are negative.    Physical Exam Triage Vital Signs ED Triage Vitals  Enc Vitals Group     BP 10/01/19 1723 (!) 127/52     Pulse Rate 10/01/19 1723 62     Resp 10/01/19 1723 18     Temp 10/01/19 1723 98.1 F (36.7 C)     Temp src --      SpO2 10/01/19 1723 100 %     Weight 10/01/19 1720 150 lb (68 kg)     Height --      Head Circumference --  Peak Flow --      Pain Score 10/01/19 1720 0     Pain Loc --      Pain Edu? --      Excl. in [place]? --    No data found.  Updated Vital Signs BP (!) 127/52 (BP Location: Right Arm)   Pulse 62   Temp 98.1 F (36.7 C)   Resp 18   Wt 68 kg   SpO2 100%   BMI 18.75 kg/m   V   Physical Exam Constitutional:      General: He is not in acute distress.    Appearance: He is well-developed.  HENT:     Head: Normocephalic and atraumatic.  Eyes:     Conjunctiva/sclera: Conjunctivae normal.     Pupils: Pupils are equal, round, and reactive to light.  Neck:     Musculoskeletal: Normal range of motion.  Cardiovascular:     Rate and Rhythm: Normal rate.  Pulmonary:     Effort: Pulmonary effort is normal. No respiratory distress.  Abdominal:     General: There is no distension.     Palpations: Abdomen is soft.  Genitourinary:    Comments:  States currently asymptomatic Musculoskeletal: Normal range of motion.  Skin:    General: Skin is warm and dry.  Neurological:     Mental Status: He is alert.      UC Treatments / Results  Labs (all labs ordered are listed, but only abnormal results are displayed) Labs Reviewed - No data to display  EKG   Radiology No results found.  Procedures Procedures (including critical care time)  Medications Ordered in UC Medications - No data to display  Initial Impression / Assessment and Plan / UC Course  I have reviewed the triage vital signs and the nursing notes.  Pertinent labs & imaging results that were available during my care of the patient were reviewed by me and considered in my medical decision making (see chart for details).     Safe sex is recommended Final Clinical Impressions(s) / UC Diagnoses   Final diagnoses:  Herpes simplex infection of penis     Discharge Instructions     Take the valacyclovir one time a day every day If you want additional refills you  need to see your primary care doctor    ED Prescriptions    Medication Sig Dispense Auth. Provider   valACYclovir (VALTREX) 500 MG tablet Take 1 tablet (500 mg total) by mouth daily. 30 tablet Raylene Everts, MD     PDMP not reviewed this encounter.   Raylene Everts, MD 10/01/19 [phone]

## 2022-12-01 ENCOUNTER — Emergency Department (HOSPITAL_COMMUNITY)
Admission: EM | Admit: 2022-12-01 | Discharge: 2022-12-01 | Disposition: A | Discharge status: Home / Self Care | Payer: Self-pay | Attending: Emergency Medicine | Admitting: Emergency Medicine

## 2022-12-01 ENCOUNTER — Other Ambulatory Visit: Payer: Self-pay

## 2022-12-01 ENCOUNTER — Ambulatory Visit: Admission: EM | Admit: 2022-12-01 | Discharge: 2022-12-01 | Discharge status: Against Medical Advice | Payer: Self-pay

## 2022-12-01 ENCOUNTER — Encounter (HOSPITAL_COMMUNITY): Payer: Self-pay

## 2022-12-01 DIAGNOSIS — S01511A Laceration without foreign body of lip, initial encounter: Secondary | ICD-10-CM | POA: Insufficient documentation

## 2022-12-01 DIAGNOSIS — Y92410 Unspecified street and highway as the place of occurrence of the external cause: Secondary | ICD-10-CM | POA: Diagnosis not present

## 2022-12-01 DIAGNOSIS — S0993XA Unspecified injury of face, initial encounter: Secondary | ICD-10-CM | POA: Diagnosis present

## 2022-12-01 MED ORDER — LIDOCAINE HCL (PF) 1 % IJ SOLN
10.0000 mL | Freq: Once | INTRAMUSCULAR | Status: AC
  Administered 2022-12-01: 10 mL
  Filled 2022-12-01: qty 30

## 2022-12-01 NOTE — Discharge Instructions (Signed)
You have been seen today for your complaint of lip laceration. Your discharge medications include Alternate tylenol and ibuprofen for pain. You may alternate these every 4 hours. You may take up to 800 mg of ibuprofen at a time and up to 1000 mg of tylenol. . Home care instructions are as follows:  Keep the external area clean and dry for 24 hours. You may clean with warm soapy water once daily after that Follow up with: any ED, urgent care, or primary care office in 5 to 7 days for suture removal Please seek immediate medical care if you develop any of the following symptoms: You have a fever. A fever is a body temperature that is 100.60F (38C) or higher. You have more redness, swelling, or pain around your wound. You have fluid or blood coming from your wound. Your wound feels warm to the touch. You have pus or a bad smell coming from your wound. At this time there does not appear to be the presence of an emergent medical condition, however there is always the potential for conditions to change. Please read and follow the below instructions.  Do not take your medicine if  develop an itchy rash, swelling in your mouth or lips, or difficulty breathing; call 911 and seek immediate emergency medical attention if this occurs.  You may review your lab tests and imaging results in their entirety on your MyChart account.  Please discuss all results of fully with your primary care provider and other specialist at your follow-up visit.  Note: Portions of this text may have been transcribed using voice recognition software. Every effort was made to ensure accuracy; however, inadvertent computerized transcription errors may still be present.
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
